# Patient Record
Sex: Male | Born: 1985 | Hispanic: Yes | Smoking: Never smoker
Health system: Southern US, Community
[De-identification: ages and names within clinical notes are randomized; demographics above are authoritative.]

---

## 2015-04-03 ENCOUNTER — Encounter (HOSPITAL_COMMUNITY): Payer: Self-pay | Admitting: Emergency Medicine

## 2015-04-03 ENCOUNTER — Emergency Department (HOSPITAL_COMMUNITY): Payer: PRIVATE HEALTH INSURANCE

## 2015-04-03 ENCOUNTER — Emergency Department (HOSPITAL_COMMUNITY)
Admission: EM | Admit: 2015-04-03 | Discharge: 2015-04-03 | Disposition: A | Discharge status: Home / Self Care | Payer: PRIVATE HEALTH INSURANCE | Attending: Emergency Medicine | Admitting: Emergency Medicine

## 2015-04-03 DIAGNOSIS — J069 Acute upper respiratory infection, unspecified: Secondary | ICD-10-CM | POA: Insufficient documentation

## 2015-04-03 DIAGNOSIS — R05 Cough: Secondary | ICD-10-CM | POA: Diagnosis present

## 2015-04-03 MED ORDER — LORATADINE-PSEUDOEPHEDRINE ER 5-120 MG PO TB12: 1.0000 | ORAL_TABLET | Freq: Two times a day (BID) | ORAL | Status: DC

## 2015-04-03 MED ORDER — DEXAMETHASONE 4 MG PO TABS: 4.0000 mg | ORAL_TABLET | Freq: Two times a day (BID) | ORAL | Status: DC

## 2015-04-03 MED ORDER — PREDNISONE 50 MG PO TABS
60.0000 mg | ORAL_TABLET | Freq: Once | ORAL | Status: AC
  Administered 2015-04-03: 60 mg via ORAL
  Filled 2015-04-03: qty 1

## 2015-04-03 MED ORDER — ALBUTEROL SULFATE HFA 108 (90 BASE) MCG/ACT IN AERS
2.0000 | INHALATION_SPRAY | Freq: Once | RESPIRATORY_TRACT | Status: AC
  Administered 2015-04-03: 2 via RESPIRATORY_TRACT
  Filled 2015-04-03: qty 6.7

## 2015-04-03 MED ORDER — ONDANSETRON HCL 4 MG PO TABS
4.0000 mg | ORAL_TABLET | Freq: Once | ORAL | Status: AC
  Administered 2015-04-03: 4 mg via ORAL
  Filled 2015-04-03: qty 1

## 2015-04-03 NOTE — ED Notes (Signed)
Pt c/o cough x 10 days

## 2015-04-03 NOTE — Discharge Instructions (Signed)
Your vital signs are within normal limits. Your chest x-ray is negative for acute problem. Your examination suggest upper respiratory infection. Please use 2 puffs of albuterol every 4 hours. Use Decadron and Claritin-D 2 times daily with a meal. Cough, Adult A cough helps to clear your throat and lungs. A cough may last only 2-3 weeks (acute), or it may last longer than 8 weeks (chronic). Many different things can cause a cough. A cough may be a sign of an illness or another medical condition. HOME CARE  Pay attention to any changes in your cough.  Take medicines only as told by your doctor.  If you were prescribed an antibiotic medicine, take it as told by your doctor. Do not stop taking it even if you start to feel better.  Talk with your doctor before you try using a cough medicine.  Drink enough fluid to keep your pee (urine) clear or pale yellow.  If the air is dry, use a cold steam vaporizer or humidifier in your home.  Stay away from things that make you cough at work or at home.  If your cough is worse at night, try using extra pillows to raise your head up higher while you sleep.  Do not smoke, and try not to be around smoke. If you need help quitting, ask your doctor.  Do not have caffeine.  Do not drink alcohol.  Rest as needed. GET HELP IF:  You have new problems (symptoms).  You cough up yellow fluid (pus).  Your cough does not get better after 2-3 weeks, or your cough gets worse.  Medicine does not help your cough and you are not sleeping well.  You have pain that gets worse or pain that is not helped with medicine.  You have a fever.  You are losing weight and you do not know why.  You have night sweats. GET HELP RIGHT AWAY IF:  You cough up blood.  You have trouble breathing.  Your heartbeat is very fast.   This information is not intended to replace advice given to you by your health care provider. Make sure you discuss any questions you have  with your health care provider.   Document Released: 10/30/2010 Document Revised: 11/07/2014 Document Reviewed: 04/25/2014 Elsevier Interactive Patient Education [employer]

## 2015-04-03 NOTE — ED Provider Notes (Signed)
CSN: [id number]     Arrival date & time 04/03/15  2131 History  By signing my name below, I, Murriel Hopper, attest that this documentation has been prepared under the direction and in the presence of Ivery Quale, PA-C.  Electronically Signed: Murriel Hopper, ED Scribe. 04/03/2015. 10:11 PM.    Chief Complaint  Patient presents with  . Cough      Patient is a 30 y.o. male presenting with cough. The history is provided by the patient. No language interpreter was used.  Cough Cough characteristics:  Non-productive Severity:  Moderate Onset quality:  Gradual Duration:  10 days Timing:  Constant Progression:  Worsening Chronicity:  New Smoker: no   Context: not animal exposure, not exposure to allergens, not fumes, not occupational exposure, not smoke exposure and not upper respiratory infection   Relieved by:  Nothing Worsened by:  Lying down Ineffective treatments:  None tried Associated symptoms: no chills, no fever, no rhinorrhea and no sore throat    HPI Comments: Roy Washington is a 30 y.o. male who presents to the Emergency Department complaining of a constant, nonproductive cough that worsens when pt lays down that has been present for 10 days. Pt states he cannot sleep at night because he mainly coughs when he lays down. Pt denies ever having a hx of a cough for this long before. Pt denies any chest injury or exposure to fumes or allergens. Pt denies an previous dx of asthma or emphysema. Pt denies congestion or any other upper respiratory symptoms.    History reviewed. No pertinent past medical history. History reviewed. No pertinent past surgical history. History reviewed. No pertinent family history. Social History  Substance Use Topics  . Smoking status: Never Smoker   . Smokeless tobacco: None  . Alcohol Use: No    Review of Systems  Constitutional: Negative for fever and chills.  HENT: Negative for congestion, rhinorrhea, sinus pressure and sore throat.    Respiratory: Positive for cough.       Allergies  Review of patient's allergies indicates no known allergies.  Home Medications   Prior to Admission medications   Not on File   BP 134/76 mmHg  Pulse 77  Temp(Src) 98.2 F (36.8 C) (Oral)  Resp 18  Ht  (1.753 m)  Wt 186 lb (84.369 kg)  BMI 27.45 kg/m2  SpO2 98% Physical Exam  Constitutional: He is oriented to person, place, and time. He appears well-developed and well-nourished.  HENT:  Head: Normocephalic and atraumatic.  Mild nasal congestion present  Neck:  No stridor  Cardiovascular: Normal rate.   Pulmonary/Chest: Effort normal.  Symmetrical rise and fall of chest Patient speaks in complete sentences Lungs are clear  Abdominal: He exhibits no distension.  Musculoskeletal:  Extremities: no edema Negative Homan's sign  Neurological: He is alert and oriented to person, place, and time.  Skin: Skin is warm and dry.  Color is good  Psychiatric: He has a normal mood and affect.  Nursing note and vitals reviewed.   ED Course  Procedures (including critical care time)  DIAGNOSTIC STUDIES: Oxygen Saturation is 98% on room air, normal by my interpretation.    COORDINATION OF CARE: 10:10 PM Discussed treatment plan with pt at bedside and pt agreed to plan. Pt will receive chest X-ray. Pt will receive Albuterol breathing treatment as well.    Labs Review Labs Reviewed - No data to display  Imaging Review No results found. I have personally reviewed and evaluated these  images and lab results as part of my medical decision-making.   EKG Interpretation None      MDM  Patient speaks in complete sentences. Patient is amateur without problem. He does exhibit active cough during interview and examination. The chest x-ray is negative for acute problem. The pulse oximetry is 98% on room air. The plan at this time is for the patient to use albuterol 2 puffs every 4 hours, Decadron and Claritin-D 2 times daily  or every 12 hours. The patient is to see his primary physician or return to the emergency department if not improving. Patient is in agreement with this discharge plan.    Final diagnoses:  None    *I have reviewed nursing notes, vital signs, and all appropriate lab and imaging results for this patient.**  *I personally performed the services described in this documentation, which was scribed in my presence. The recorded information has been reviewed and is accurate.[address], PA-C 04/03/15 2248  Eber Hong, MD 04/04/15 [phone]

## 2017-07-06 IMAGING — DX DG CHEST 2V
2 series · 2 of 2 positions shown · non-contrast
Comparison: None.

CLINICAL DATA: Nonproductive cough for 10 days.

EXAM:
CHEST  2 VIEW

[chest pa]
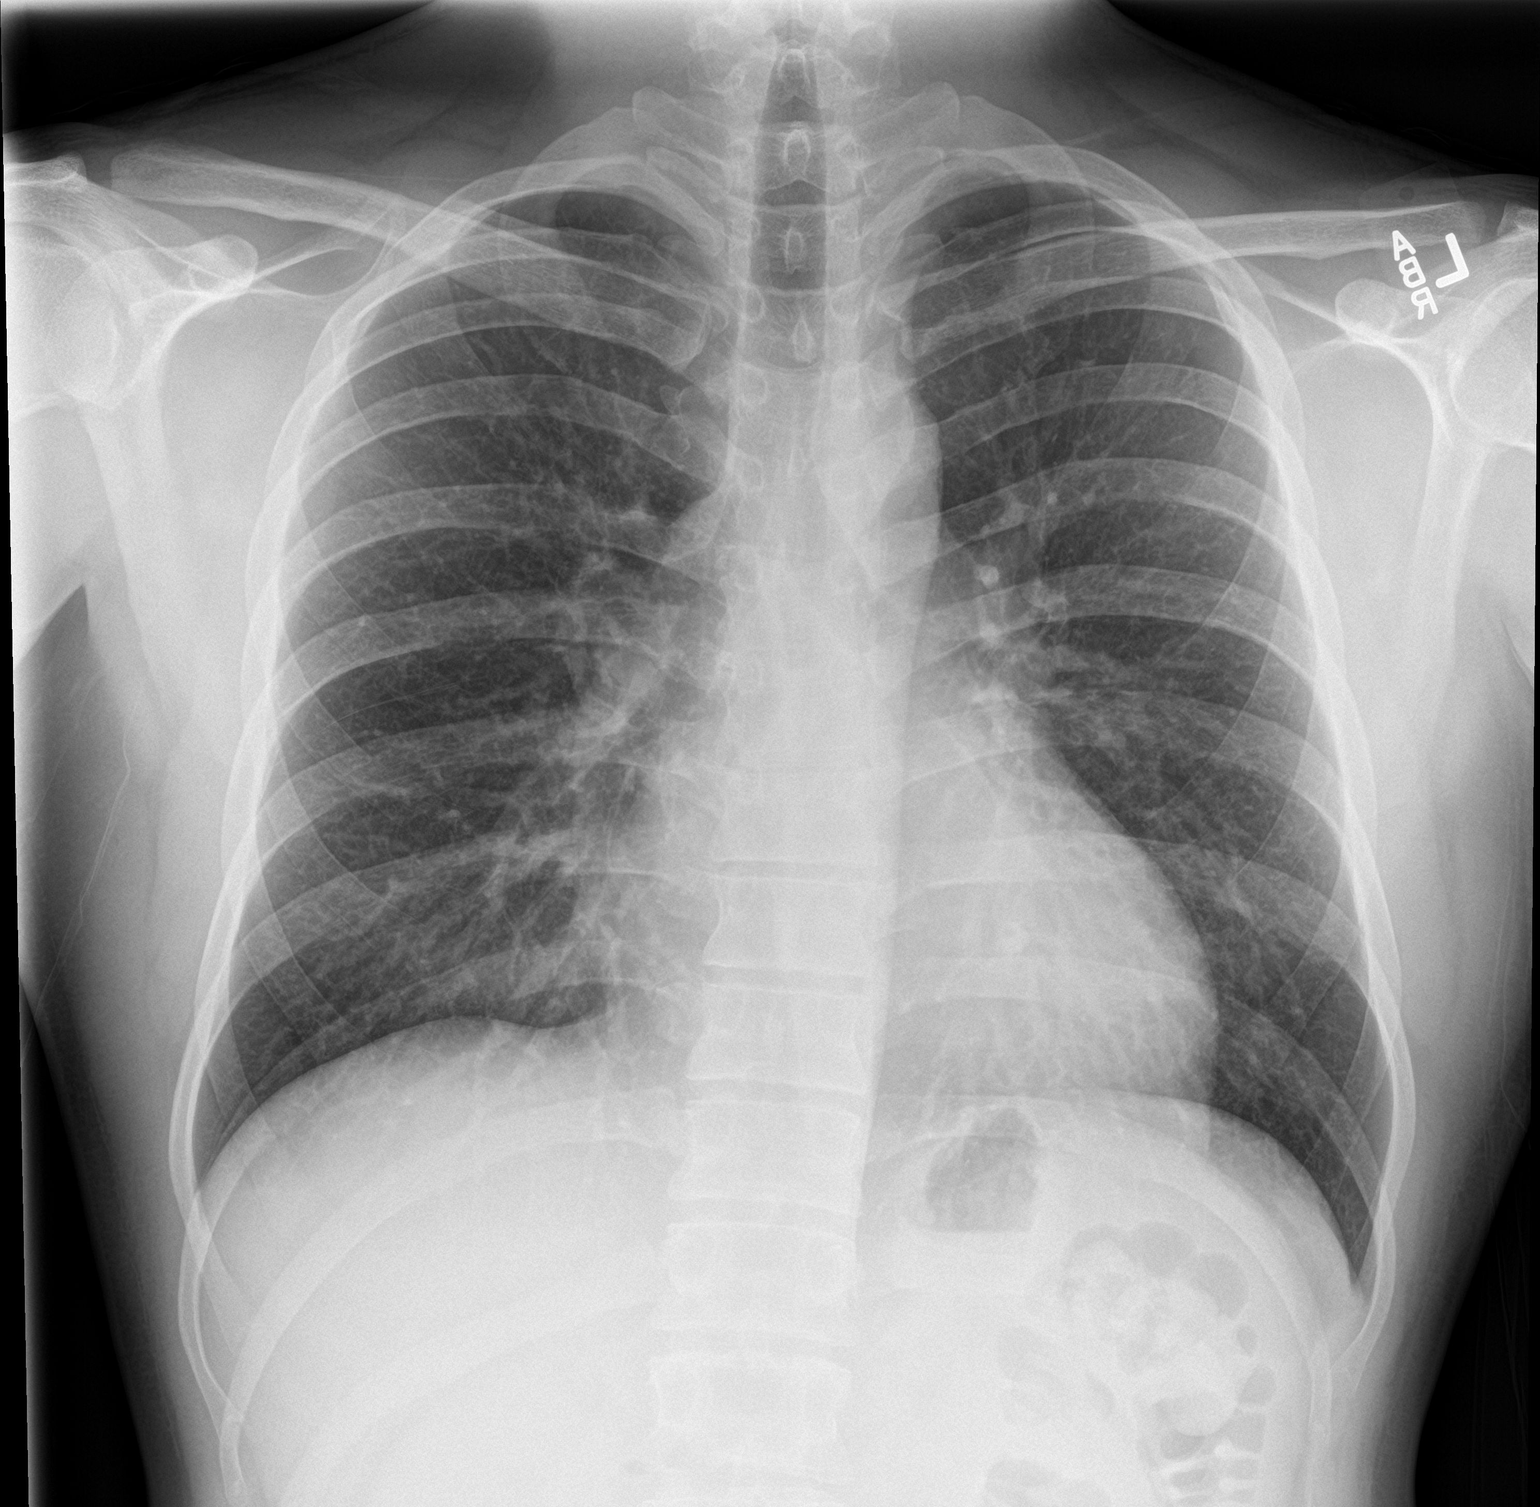

[chest lat]
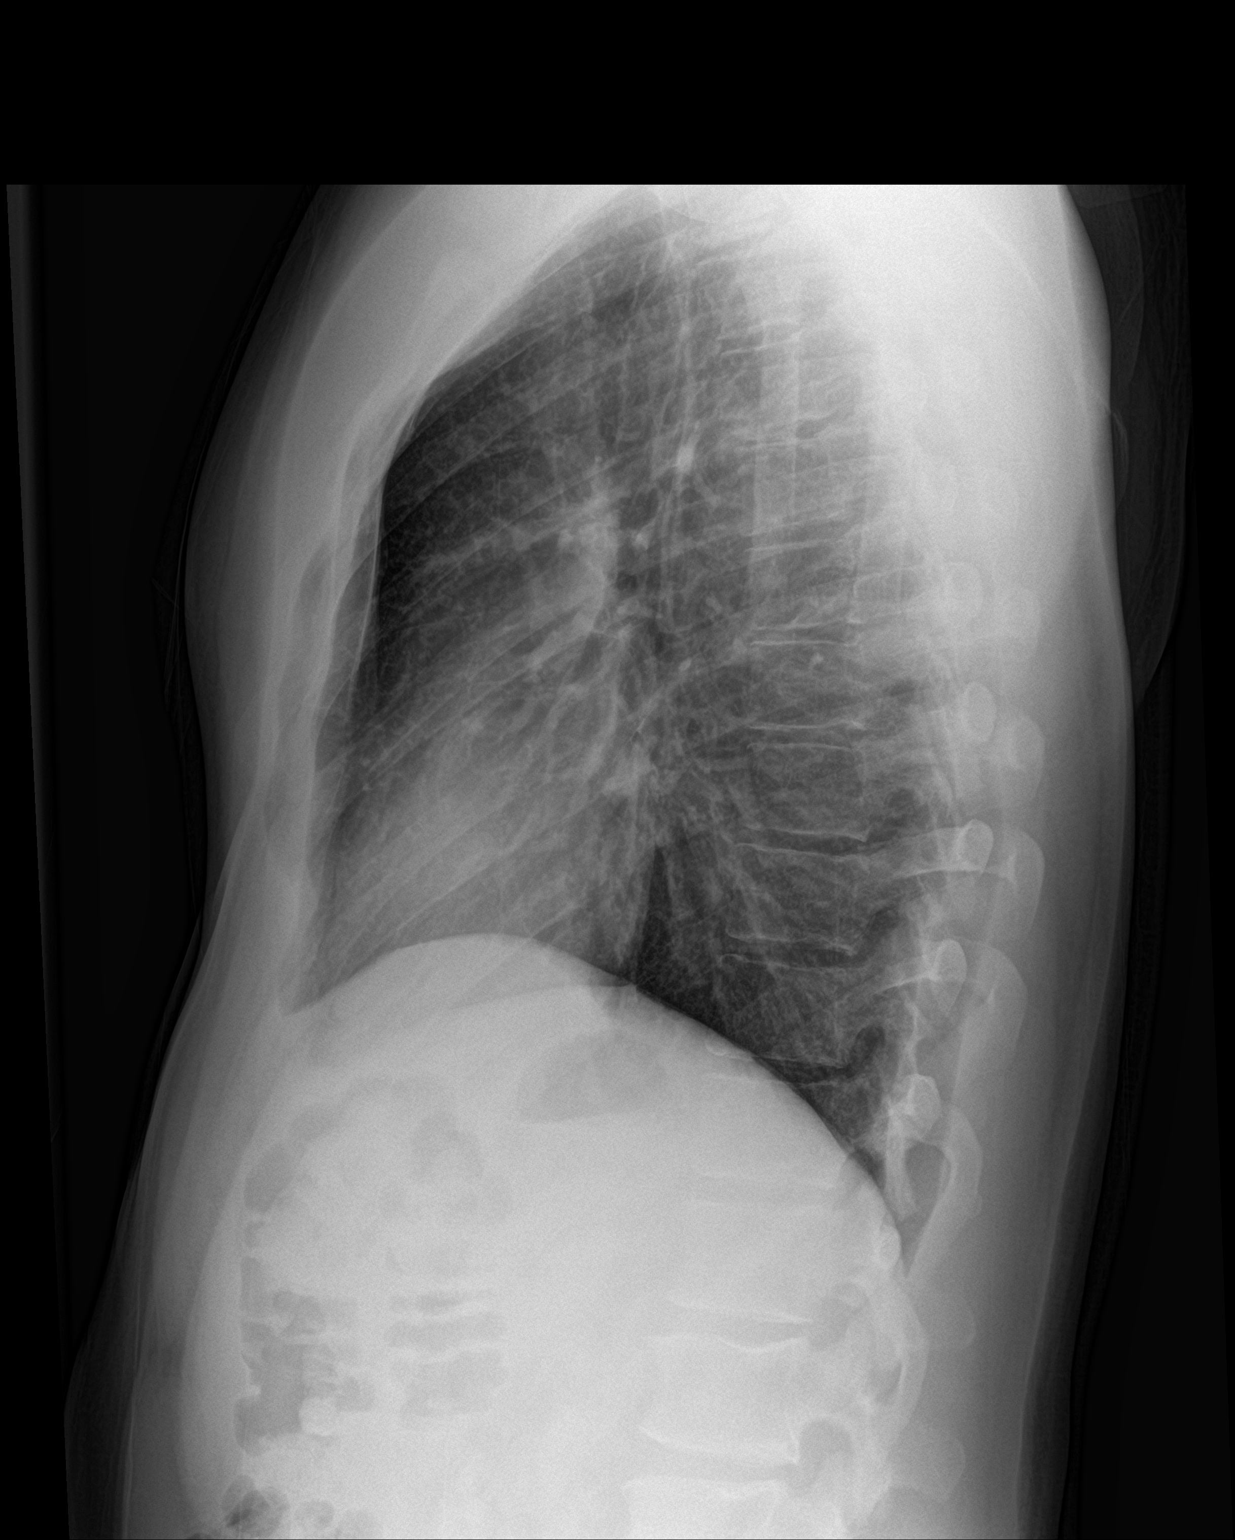

[2 of 2 positions shown; findings below may reference images not displayed]

FINDINGS: The cardiomediastinal contours are normal. The lungs are clear.
Pulmonary vasculature is normal. No consolidation, pleural effusion,
or pneumothorax. No acute osseous abnormalities are seen. Question
minimal pectus deformity.
IMPRESSION: No acute pulmonary process.

## 2020-01-07 ENCOUNTER — Other Ambulatory Visit: Payer: Self-pay

## 2020-01-07 ENCOUNTER — Ambulatory Visit
Admission: EM | Admit: 2020-01-07 | Discharge: 2020-01-07 | Disposition: A | Discharge status: Home / Self Care | Payer: PRIVATE HEALTH INSURANCE | Attending: Physician Assistant | Admitting: Physician Assistant

## 2020-01-07 ENCOUNTER — Encounter: Payer: Self-pay | Admitting: Emergency Medicine

## 2020-01-07 ENCOUNTER — Ambulatory Visit: Payer: Self-pay

## 2020-01-07 DIAGNOSIS — Z202 Contact with and (suspected) exposure to infections with a predominantly sexual mode of transmission: Secondary | ICD-10-CM

## 2020-01-07 NOTE — ED Provider Notes (Signed)
[facility]    CSN: [id number] Arrival date & time: 01/07/20  1154      History   Chief Complaint Chief Complaint  Patient presents with  . Appointment    1200  . Exposure to STD    HPI JAKEEM GRAPE is a 34 y.o. male.   34 year old male comes in for STD testing. He is asymptomatic. Denies urinary symptoms such as frequency, dysuria, hematuria. Denies penile discharge, penile sore/ulcer, testicular swelling, testicular pain. Sexually active with 1 male partner, no condom use. States partner tested positive for chlamydia.      History reviewed. No pertinent past medical history.  There are no problems to display for this patient.   History reviewed. No pertinent surgical history.     Home Medications    Prior to Admission medications   Not on File    Family History Family History  Family history unknown: Yes    Social History Social History   Tobacco Use  . Smoking status: Never Smoker  Substance Use Topics  . Alcohol use: No  . Drug use: No     Allergies   Patient has no known allergies.   Review of Systems Review of Systems  Reason unable to perform ROS: See HPI as above.     Physical Exam Triage Vital Signs ED Triage Vitals [01/07/20 1212]  Enc Vitals Group     BP (!) 155/97     Pulse Rate 97     Resp 18     Temp 97.9 F (36.6 C)     Temp Source Oral     SpO2 95 %     Weight      Height      Head Circumference      Peak Flow      Pain Score 0     Pain Loc      Pain Edu?      Excl. in GC?    No data found.  Updated Vital Signs BP (!) 155/97 (BP Location: Right Arm)   Pulse 97   Temp 97.9 F (36.6 C) (Oral)   Resp 18   SpO2 95%   Physical Exam Constitutional:      General: He is not in acute distress.    Appearance: Normal appearance. He is well-developed. He is not toxic-appearing or diaphoretic.  HENT:     Head: Normocephalic and atraumatic.  Eyes:     Conjunctiva/sclera: Conjunctivae normal.      Pupils: Pupils are equal, round, and reactive to light.  Pulmonary:     Effort: Pulmonary effort is normal. No respiratory distress.  Musculoskeletal:     Cervical back: Normal range of motion and neck supple.  Skin:    General: Skin is warm and dry.  Neurological:     Mental Status: He is alert and oriented to person, place, and time.      UC Treatments / Results  Labs (all labs ordered are listed, but only abnormal results are displayed) Labs Reviewed  CYTOLOGY, (ORAL, ANAL, URETHRAL) ANCILLARY ONLY    EKG   Radiology No results found.  Procedures Procedures (including critical care time)  Medications Ordered in UC Medications - No data to display  Initial Impression / Assessment and Plan / UC Course  I have reviewed the triage vital signs and the nursing notes.  Pertinent labs & imaging results that were available during my care of the patient were reviewed by me and considered in  my medical decision making (see chart for details).  Cytology sent, patient will be contacted with any positive results that require additional treatment. Patient to refrain from sexual activity until testing results return. Return precautions given.   Final Clinical Impressions(s) / UC Diagnoses   Final diagnoses:  Exposure to STD    ED Prescriptions    None     PDMP not reviewed this encounter.   Belinda Fisher, PA-C 01/07/20 1252

## 2020-01-07 NOTE — Discharge Instructions (Signed)
Cytology sent, you will be contacted with any positive results that requires further treatment. Refrain from sexual activity until testing results return. If positive testing with treatment, refrain from sexual activity for another 7-10 days. If developing testicular swelling/pain, penile lesion/sore, follow up for reevaluation.

## 2020-01-07 NOTE — ED Triage Notes (Signed)
Pt here for STD testing; denies any sx

## 2020-01-09 ENCOUNTER — Telehealth (HOSPITAL_COMMUNITY): Payer: Self-pay | Admitting: Emergency Medicine

## 2020-01-09 LAB — CYTOLOGY, (ORAL, ANAL, URETHRAL) ANCILLARY ONLY
Chlamydia: POSITIVE — AB
Comment: NEGATIVE
Comment: NEGATIVE
Comment: NORMAL
Neisseria Gonorrhea: NEGATIVE
Trichomonas: NEGATIVE

## 2020-01-09 MED ORDER — AZITHROMYCIN 250 MG PO TABS: 1000.0000 mg | ORAL_TABLET | Freq: Once | ORAL | 0 refills | Status: AC
